# Patient Record
Sex: Female | Born: 1970 | Race: White | Hispanic: No | Marital: Single | Smoking: Never smoker
Health system: Southern US, Community
[De-identification: ages and names within clinical notes are randomized; demographics above are authoritative.]

## PROBLEM LIST (undated history)

## (undated) DIAGNOSIS — I10 Essential (primary) hypertension: Secondary | ICD-10-CM

## (undated) DIAGNOSIS — F419 Anxiety disorder, unspecified: Secondary | ICD-10-CM

## (undated) DIAGNOSIS — D649 Anemia, unspecified: Secondary | ICD-10-CM

## (undated) DIAGNOSIS — T7840XA Allergy, unspecified, initial encounter: Secondary | ICD-10-CM

## (undated) DIAGNOSIS — F32A Depression, unspecified: Secondary | ICD-10-CM

## (undated) HISTORY — DX: Anxiety disorder, unspecified: F41.9

## (undated) HISTORY — DX: Allergy, unspecified, initial encounter: T78.40XA

## (undated) HISTORY — DX: Depression, unspecified: F32.A

## (undated) HISTORY — DX: Anemia, unspecified: D64.9

---

## 2004-02-05 ENCOUNTER — Ambulatory Visit: Payer: Self-pay | Admitting: Family Medicine

## 2004-09-09 ENCOUNTER — Other Ambulatory Visit: Admission: RE | Admit: 2004-09-09 | Discharge: 2004-09-09 | Payer: Self-pay | Admitting: Obstetrics and Gynecology

## 2005-03-22 ENCOUNTER — Other Ambulatory Visit: Admission: RE | Admit: 2005-03-22 | Discharge: 2005-03-22 | Payer: Self-pay | Admitting: Obstetrics and Gynecology

## 2005-06-22 ENCOUNTER — Ambulatory Visit: Payer: Self-pay | Admitting: Family Medicine

## 2006-05-06 ENCOUNTER — Ambulatory Visit: Payer: Self-pay | Admitting: Family Medicine

## 2011-08-03 ENCOUNTER — Other Ambulatory Visit: Payer: Self-pay | Admitting: Obstetrics and Gynecology

## 2011-08-03 DIAGNOSIS — Z1231 Encounter for screening mammogram for malignant neoplasm of breast: Secondary | ICD-10-CM

## 2011-09-03 ENCOUNTER — Ambulatory Visit
Admission: RE | Admit: 2011-09-03 | Discharge: 2011-09-03 | Disposition: A | Discharge status: Home / Self Care | Source: Ambulatory Visit | Attending: Obstetrics and Gynecology | Admitting: Obstetrics and Gynecology

## 2011-09-03 DIAGNOSIS — Z1231 Encounter for screening mammogram for malignant neoplasm of breast: Secondary | ICD-10-CM

## 2011-09-06 ENCOUNTER — Other Ambulatory Visit: Payer: Self-pay | Admitting: Obstetrics and Gynecology

## 2011-09-06 DIAGNOSIS — R928 Other abnormal and inconclusive findings on diagnostic imaging of breast: Secondary | ICD-10-CM

## 2011-09-08 ENCOUNTER — Ambulatory Visit
Admission: RE | Admit: 2011-09-08 | Discharge: 2011-09-08 | Disposition: A | Discharge status: Home / Self Care | Source: Ambulatory Visit | Attending: Obstetrics and Gynecology | Admitting: Obstetrics and Gynecology

## 2011-09-08 DIAGNOSIS — R928 Other abnormal and inconclusive findings on diagnostic imaging of breast: Secondary | ICD-10-CM

## 2012-07-27 ENCOUNTER — Other Ambulatory Visit: Payer: Self-pay

## 2012-07-27 DIAGNOSIS — Z1231 Encounter for screening mammogram for malignant neoplasm of breast: Secondary | ICD-10-CM

## 2012-09-04 ENCOUNTER — Ambulatory Visit

## 2012-09-11 ENCOUNTER — Ambulatory Visit
Admission: RE | Admit: 2012-09-11 | Discharge: 2012-09-11 | Disposition: A | Discharge status: Home / Self Care | Source: Ambulatory Visit

## 2012-09-11 DIAGNOSIS — Z1231 Encounter for screening mammogram for malignant neoplasm of breast: Secondary | ICD-10-CM

## 2013-04-26 ENCOUNTER — Other Ambulatory Visit: Payer: Self-pay

## 2013-04-26 DIAGNOSIS — Z1231 Encounter for screening mammogram for malignant neoplasm of breast: Secondary | ICD-10-CM

## 2013-07-16 DIAGNOSIS — J309 Allergic rhinitis, unspecified: Secondary | ICD-10-CM | POA: Insufficient documentation

## 2013-09-12 ENCOUNTER — Ambulatory Visit
Admission: RE | Admit: 2013-09-12 | Discharge: 2013-09-12 | Disposition: A | Discharge status: Home / Self Care | Payer: BC Managed Care – PPO | Source: Ambulatory Visit

## 2013-09-12 ENCOUNTER — Encounter (INDEPENDENT_AMBULATORY_CARE_PROVIDER_SITE_OTHER): Payer: Self-pay

## 2013-09-12 DIAGNOSIS — Z1231 Encounter for screening mammogram for malignant neoplasm of breast: Secondary | ICD-10-CM

## 2016-07-06 IMAGING — MG MM SCREENING BREAST TOMO BILATERAL
9 of 12 series · 9 of 28 positions shown · non-contrast
Comparison: Previous exam(s).

CLINICAL DATA: Screening.

EXAM:
DIGITAL SCREENING BILATERAL MAMMOGRAM WITH 3D TOMO WITH CAD

[L CC]
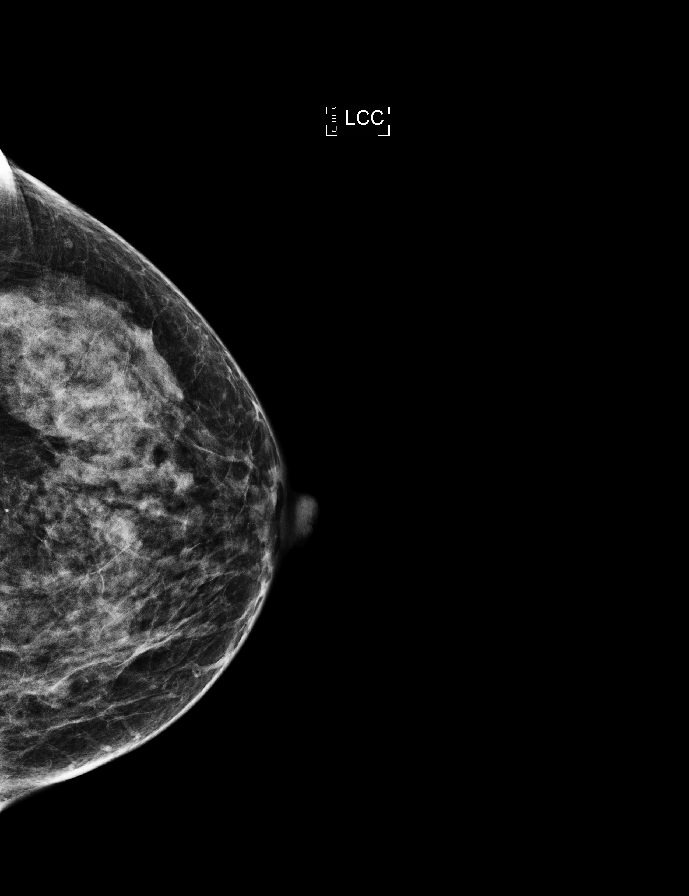

[L CC synth-2D]
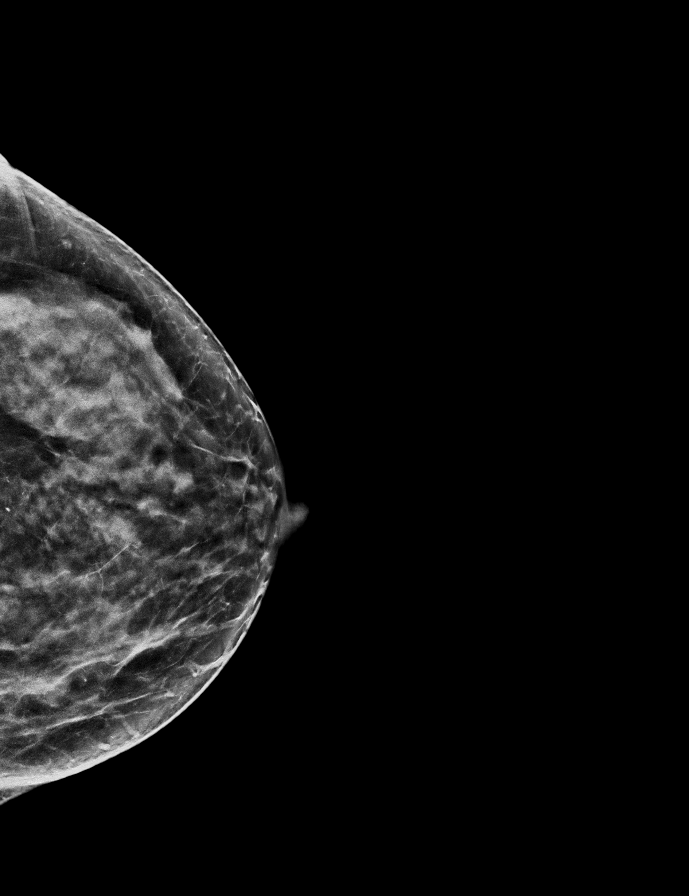

[R MLO]
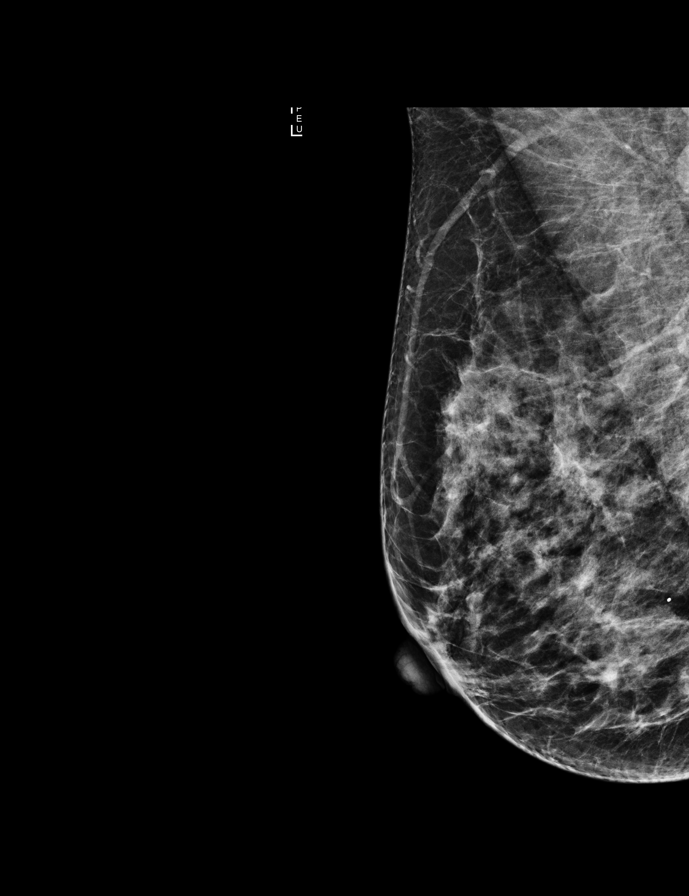

[L MLO]
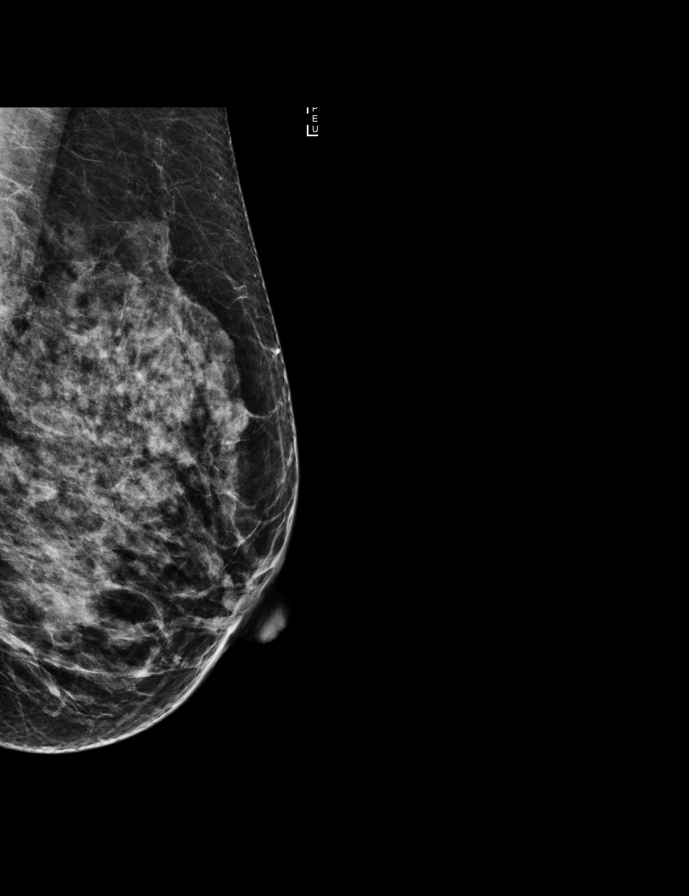

[R CC synth-2D]
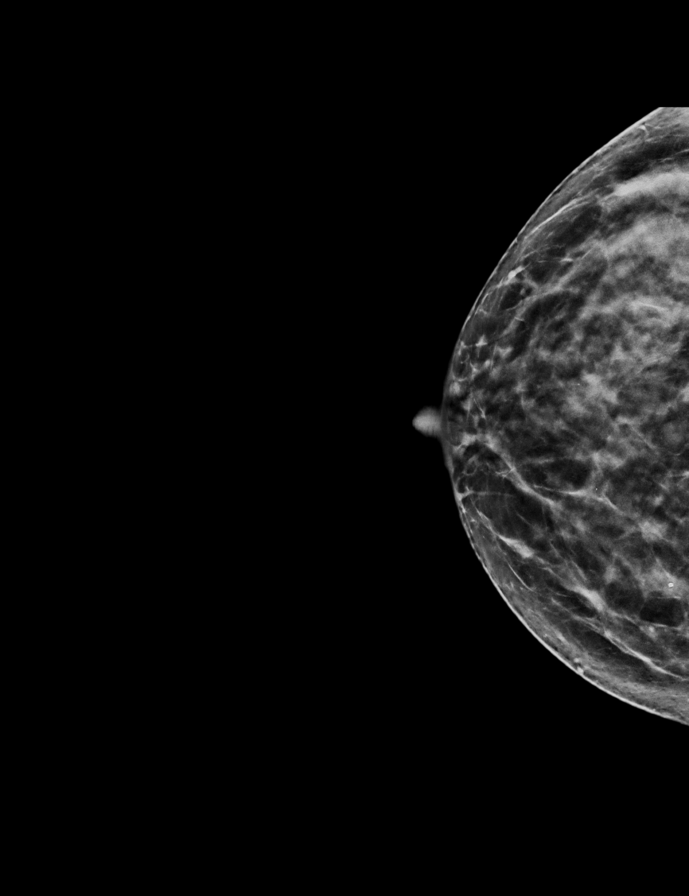

[R MLO synth-2D]
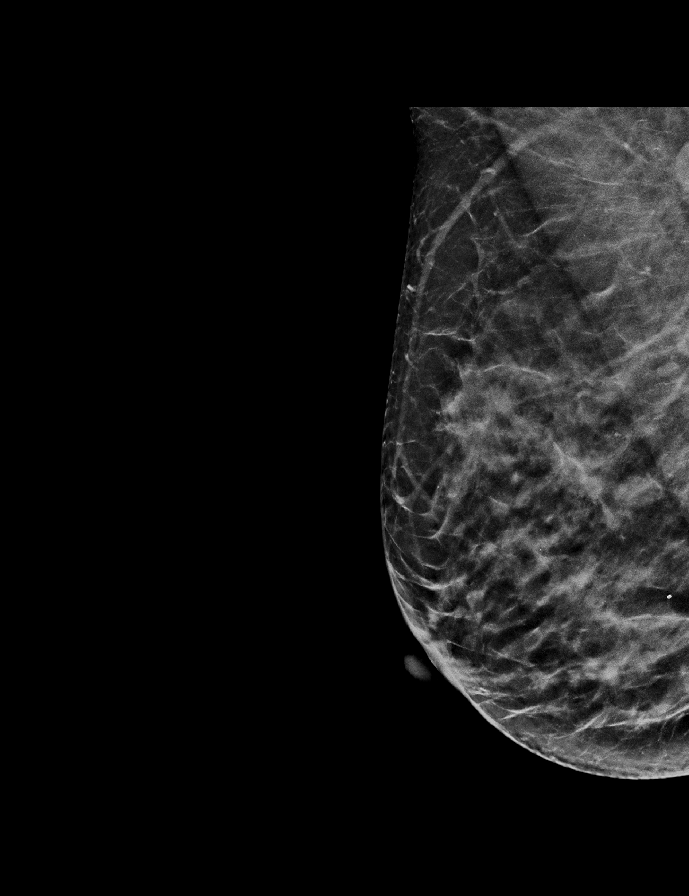

[R CC]
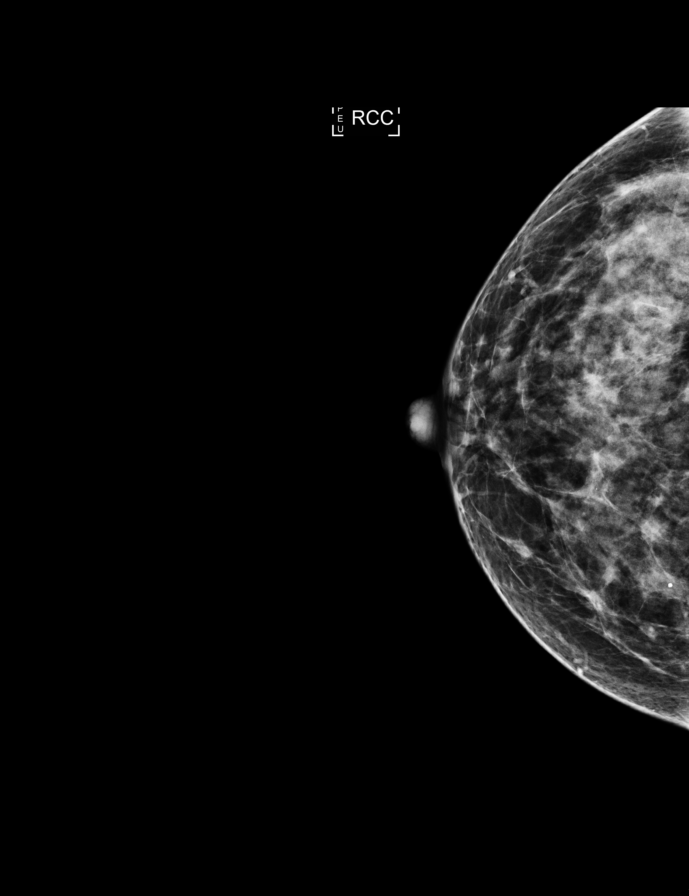

[L MLO synth-2D]
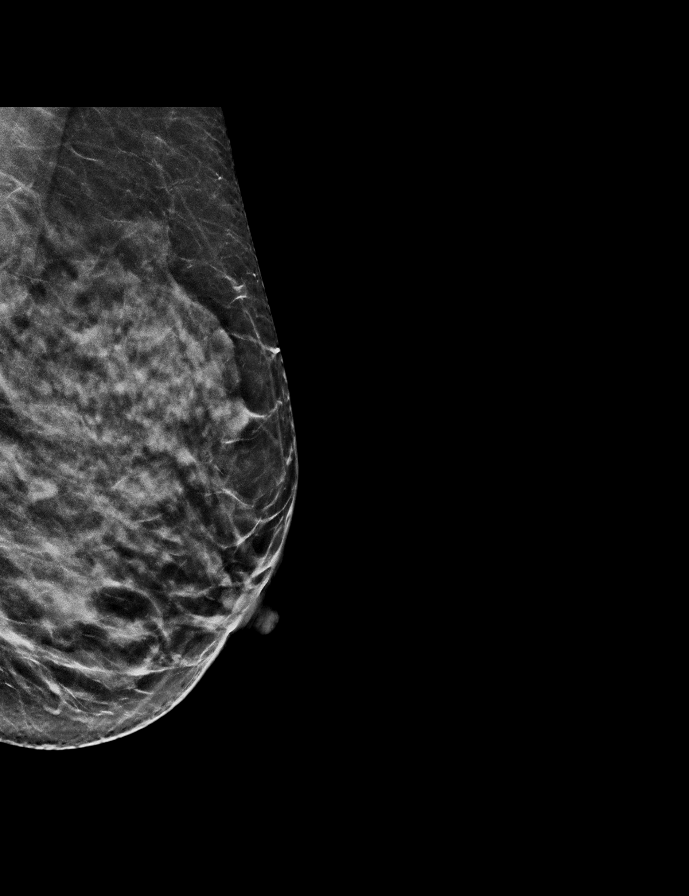

[L CC tomo · tomo slice 29/58.0]
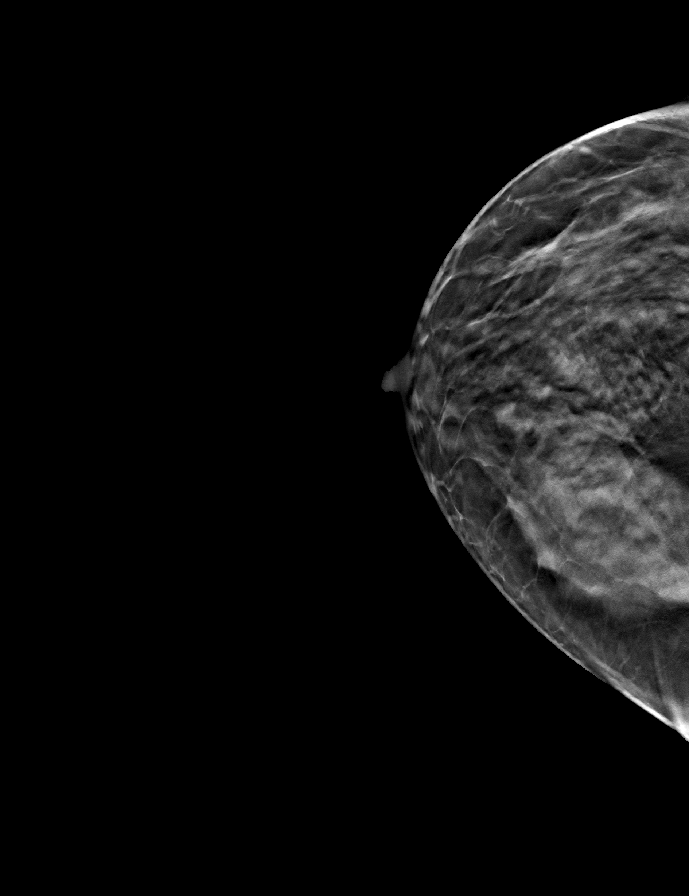

[9 of 28 positions shown; findings below may reference images not displayed]

ACR Breast Density Category c: The breast tissue is heterogeneously
dense, which may obscure small masses.
FINDINGS: There are no findings suspicious for malignancy. Images were
processed with CAD.
IMPRESSION: No mammographic evidence of malignancy. A result letter of this
screening mammogram will be mailed directly to the patient.

RECOMMENDATION:
Screening mammogram in one year. (Code:OA-G-1SS)

BI-RADS CATEGORY  1: Negative.

## 2018-02-09 DIAGNOSIS — I10 Essential (primary) hypertension: Secondary | ICD-10-CM | POA: Insufficient documentation

## 2018-02-09 DIAGNOSIS — F411 Generalized anxiety disorder: Secondary | ICD-10-CM | POA: Insufficient documentation

## 2018-11-10 DIAGNOSIS — Z0000 Encounter for general adult medical examination without abnormal findings: Secondary | ICD-10-CM | POA: Insufficient documentation

## 2018-11-10 DIAGNOSIS — H9190 Unspecified hearing loss, unspecified ear: Secondary | ICD-10-CM | POA: Insufficient documentation

## 2018-12-04 ENCOUNTER — Other Ambulatory Visit: Payer: Self-pay | Admitting: Obstetrics and Gynecology

## 2018-12-04 DIAGNOSIS — N6320 Unspecified lump in the left breast, unspecified quadrant: Secondary | ICD-10-CM

## 2018-12-11 ENCOUNTER — Other Ambulatory Visit: Payer: Self-pay

## 2018-12-11 ENCOUNTER — Ambulatory Visit
Admission: RE | Admit: 2018-12-11 | Discharge: 2018-12-11 | Disposition: A | Discharge status: Home / Self Care | Source: Ambulatory Visit | Attending: Obstetrics and Gynecology | Admitting: Obstetrics and Gynecology

## 2018-12-11 DIAGNOSIS — N6320 Unspecified lump in the left breast, unspecified quadrant: Secondary | ICD-10-CM

## 2019-07-16 DIAGNOSIS — H6993 Unspecified Eustachian tube disorder, bilateral: Secondary | ICD-10-CM | POA: Insufficient documentation

## 2019-07-16 DIAGNOSIS — J343 Hypertrophy of nasal turbinates: Secondary | ICD-10-CM | POA: Insufficient documentation

## 2019-07-16 DIAGNOSIS — J342 Deviated nasal septum: Secondary | ICD-10-CM | POA: Insufficient documentation

## 2020-03-11 LAB — COLOGUARD: COLOGUARD: NEGATIVE

## 2020-12-24 ENCOUNTER — Ambulatory Visit: Admission: EM | Admit: 2020-12-24 | Discharge: 2020-12-24 | Disposition: A | Discharge status: Home / Self Care

## 2020-12-24 ENCOUNTER — Other Ambulatory Visit: Payer: Self-pay

## 2020-12-24 ENCOUNTER — Ambulatory Visit

## 2020-12-24 DIAGNOSIS — H6983 Other specified disorders of Eustachian tube, bilateral: Secondary | ICD-10-CM

## 2020-12-24 DIAGNOSIS — J3089 Other allergic rhinitis: Secondary | ICD-10-CM

## 2020-12-24 HISTORY — DX: Essential (primary) hypertension: I10

## 2020-12-24 MED ORDER — PREDNISONE 10 MG PO TABS: 30.0000 mg | ORAL_TABLET | Freq: Every day | ORAL | 0 refills | Status: DC

## 2020-12-24 MED ORDER — PSEUDOEPHEDRINE HCL 30 MG PO TABS: 30.0000 mg | ORAL_TABLET | Freq: Three times a day (TID) | ORAL | 0 refills | Status: DC | PRN

## 2020-12-24 MED ORDER — MONTELUKAST SODIUM 10 MG PO TABS: 10.0000 mg | ORAL_TABLET | Freq: Every day | ORAL | 0 refills | Status: AC

## 2020-12-24 NOTE — ED Provider Notes (Signed)
[facility]   MRN: [id number] DOB: 1970/05/25  Subjective:   Catherine Higgins is a 50 y.o. female presenting for 2 day history of bilateral ear fullness, worse over the right side.  Has very transient occasional sharp pains but no fever, drainage, tinnitus.  Has a history of persistent allergic rhinitis, deviated septum.  Currently she uses Atrovent nasal spray, just switch back to Allegra.  Has been advised to have a procedure to correct her deviated septum but has not gone through with this.  About 6 months ago, she had a similar episode and was advised to have a prednisone course for eustachian tube dysfunction.  She reports that she did very well with this.  No current facility-administered medications for this encounter.  Current Outpatient Medications:    buPROPion (WELLBUTRIN XL) 300 MG 24 hr tablet, Take by mouth., Disp: , Rfl:    LORazepam (ATIVAN) 0.5 MG tablet, lorazepam 0.5 mg tablet  TAKE 1 TABLET BY MOUTH EVERY DAY AS NEEDED FOR ANXIETY, Disp: , Rfl:    acyclovir (ZOVIRAX) 400 MG tablet, Take 400 mg by mouth 2 (two) times daily., Disp: , Rfl:    fexofenadine (ALLEGRA) 180 MG tablet, Take by mouth., Disp: , Rfl:    hydrochlorothiazide (HYDRODIURIL) 25 MG tablet, Take 25 mg by mouth daily., Disp: , Rfl:    ipratropium (ATROVENT) 0.03 % nasal spray, SMARTSIG:1 Spray(s) Both Nares Twice Daily PRN, Disp: , Rfl:    LEVONEST 50-30/75-40/ 125-30 MCG TABS, Take 1 tablet by mouth daily., Disp: , Rfl:    Allergies  Allergen Reactions   Amoxicillin Itching and Hives   Clarithromycin Itching and Hives   Codeine Itching and Hives   Sulfamethoxazole-Trimethoprim Itching and Hives    Past Medical History:  Diagnosis Date   Hypertension      History reviewed. No pertinent surgical history.  Family History  Adopted: Yes    Social History   Tobacco Use   Smoking status: Never   Smokeless tobacco: Never    ROS   Objective:   Vitals: BP (!) 145/89 (BP  Location: Right Arm)   Pulse 89   Temp 99 F (37.2 C) (Oral)   Resp 18   SpO2 98%   Physical Exam Constitutional:      General: She is not in acute distress.    Appearance: Normal appearance. She is well-developed. She is not ill-appearing, toxic-appearing or diaphoretic.  HENT:     Head: Normocephalic and atraumatic.     Right Ear: Tympanic membrane, ear canal and external ear normal. There is no impacted cerumen.     Left Ear: Tympanic membrane, ear canal and external ear normal. There is no impacted cerumen.     Nose: Nose normal.     Mouth/Throat:     Mouth: Mucous membranes are moist.     Pharynx: Oropharynx is clear.  Eyes:     General: No scleral icterus.       Right eye: No discharge.        Left eye: No discharge.     Extraocular Movements: Extraocular movements intact.     Conjunctiva/sclera: Conjunctivae normal.     Pupils: Pupils are equal, round, and reactive to light.  Cardiovascular:     Rate and Rhythm: Normal rate.  Pulmonary:     Effort: Pulmonary effort is normal.  Skin:    General: Skin is warm and dry.  Neurological:     General: No focal deficit present.     Mental  Status: She is alert and oriented to person, place, and time.  Psychiatric:        Mood and Affect: Mood normal.        Behavior: Behavior normal.        Thought Content: Thought content normal.        Judgment: Judgment normal.    Assessment and Plan :   PDMP not reviewed this encounter.  1. Eustachian tube dysfunction, bilateral   2. Allergic rhinitis due to other allergic trigger, unspecified seasonality    Unremarkable ENT exam.  Will use conservative management for what I suspect is eustachian tube dysfunction.  Recommended starting Flonase, Allegra, Singulair.  As she did well with the prednisone course before, offered her a repeat, recommended that she use Sudafed thereafter as needed.  Discussed that this is safe to use at a lower dose with her high blood pressure.  Follow-up  with PCP for recheck on the blood pressure.  Counseled patient on potential for adverse effects with medications prescribed/recommended today, ER and return-to-clinic precautions discussed, patient verbalized understanding.    Jaynee Eagles, PA-C 12/24/20 1349

## 2020-12-24 NOTE — ED Triage Notes (Signed)
Two day h/o right ear clogged sensation. Notes decreased hearing on that ear. Has tried peroxide and cerumen removal without a decrease in sxs. No left ear sxs.

## 2020-12-24 NOTE — Discharge Instructions (Addendum)
For your allergies, take Flonase, Allegra and Singulair daily. Use Sudafed at 14m 2-3 times daily keeping a close eye on the effects it has on your blood pressure. For your ETD, we'll use a short prednisone course.

## 2021-10-04 IMAGING — MG MM DIGITAL DIAGNOSTIC UNILAT*L* W/ TOMO W/ CAD
4 series · 4 of 12 positions shown · non-contrast
Comparison: Previous exam(s).

CLINICAL DATA: Possible left breast mass seen on most recent
screening mammography.

EXAM:
DIGITAL DIAGNOSTIC LEFT MAMMOGRAM WITH CAD AND TOMO
ULTRASOUND LEFT BREAST

[L MLO synth-2D]
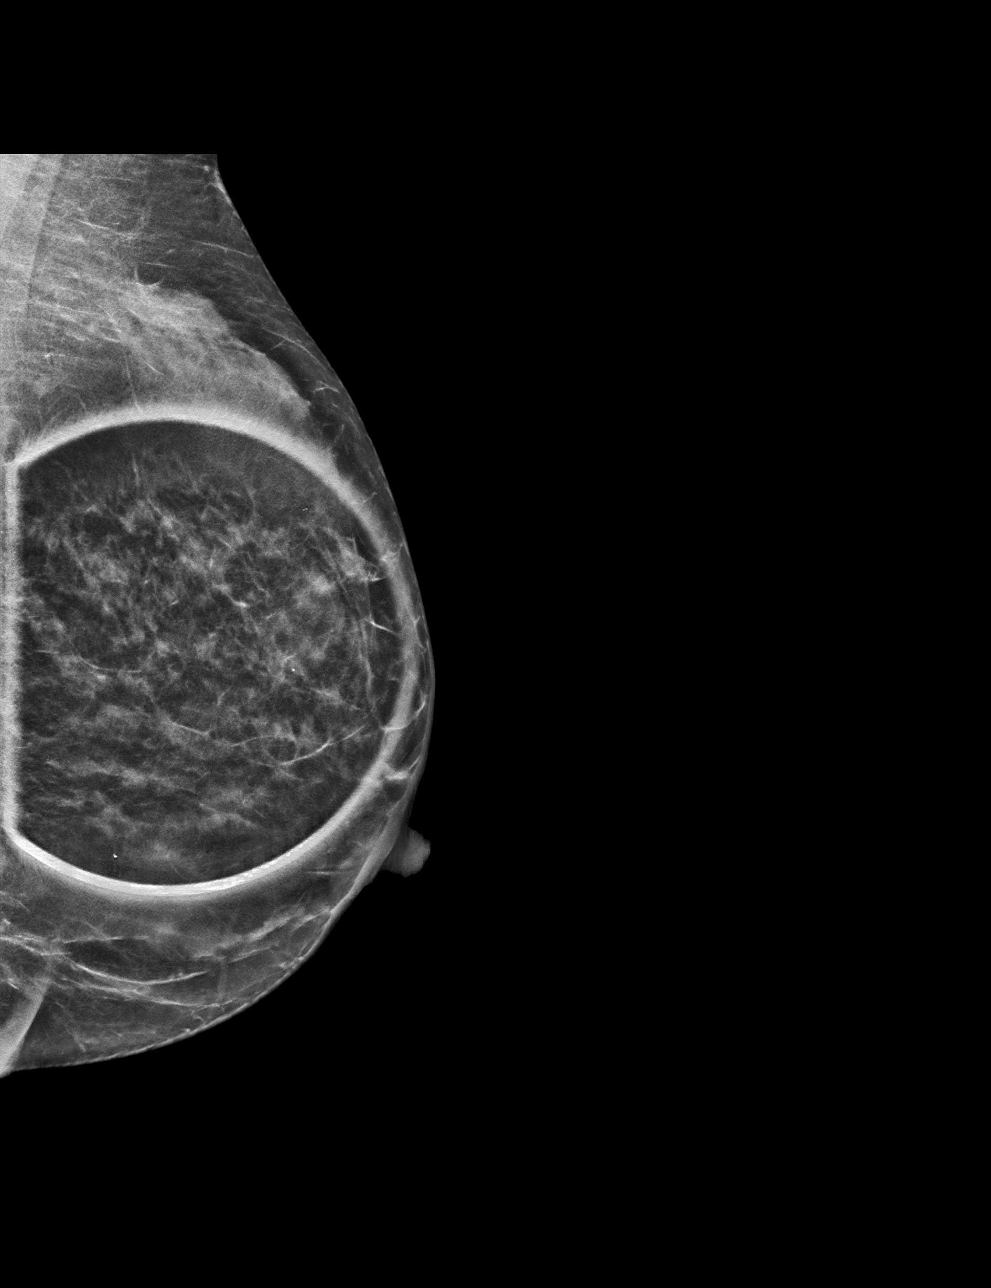

[L CC synth-2D]
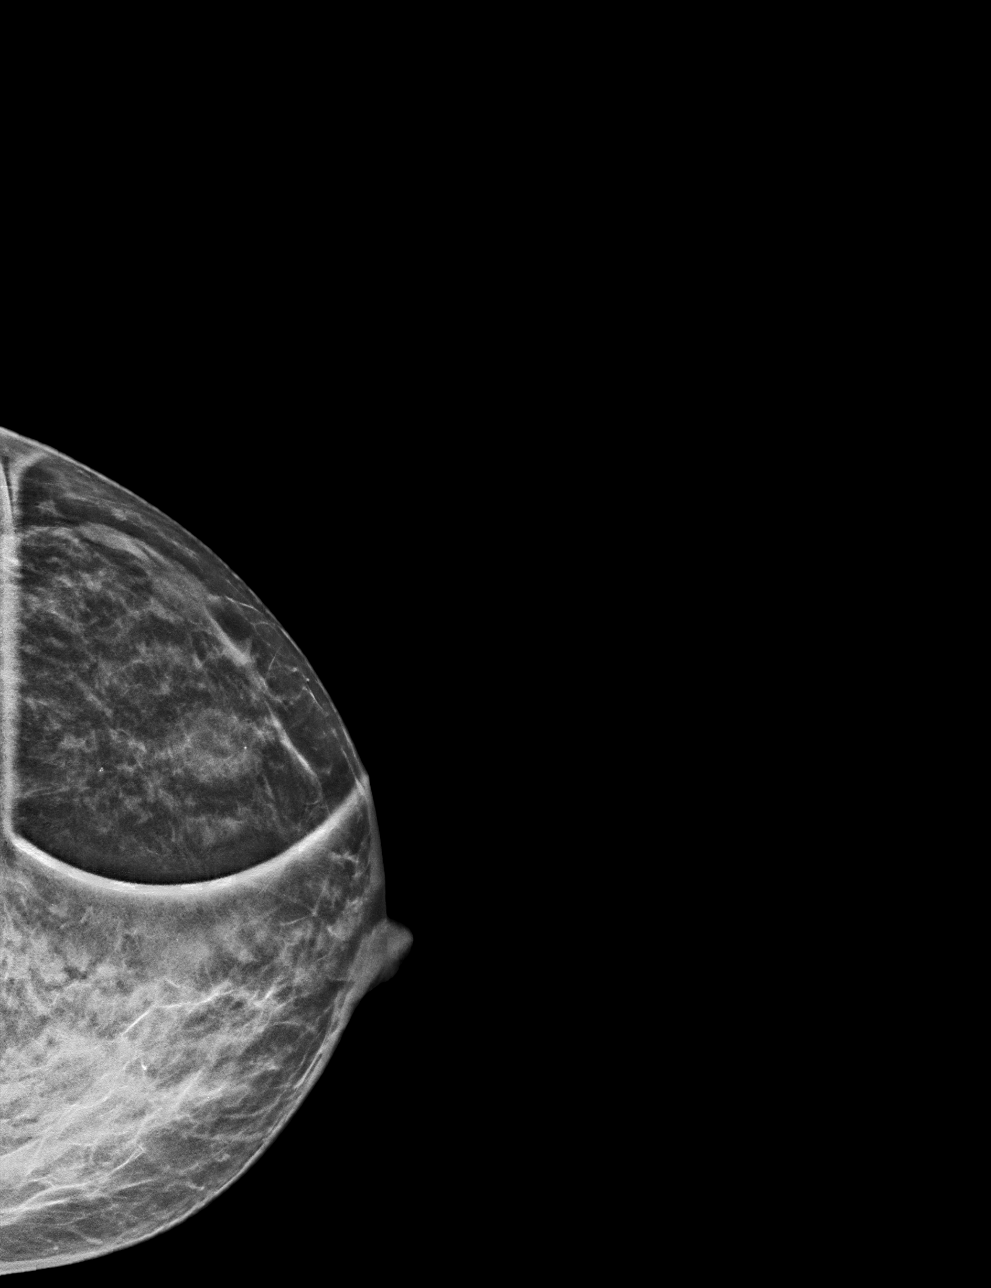

[L CC tomo · tomo slice 21/40.0]
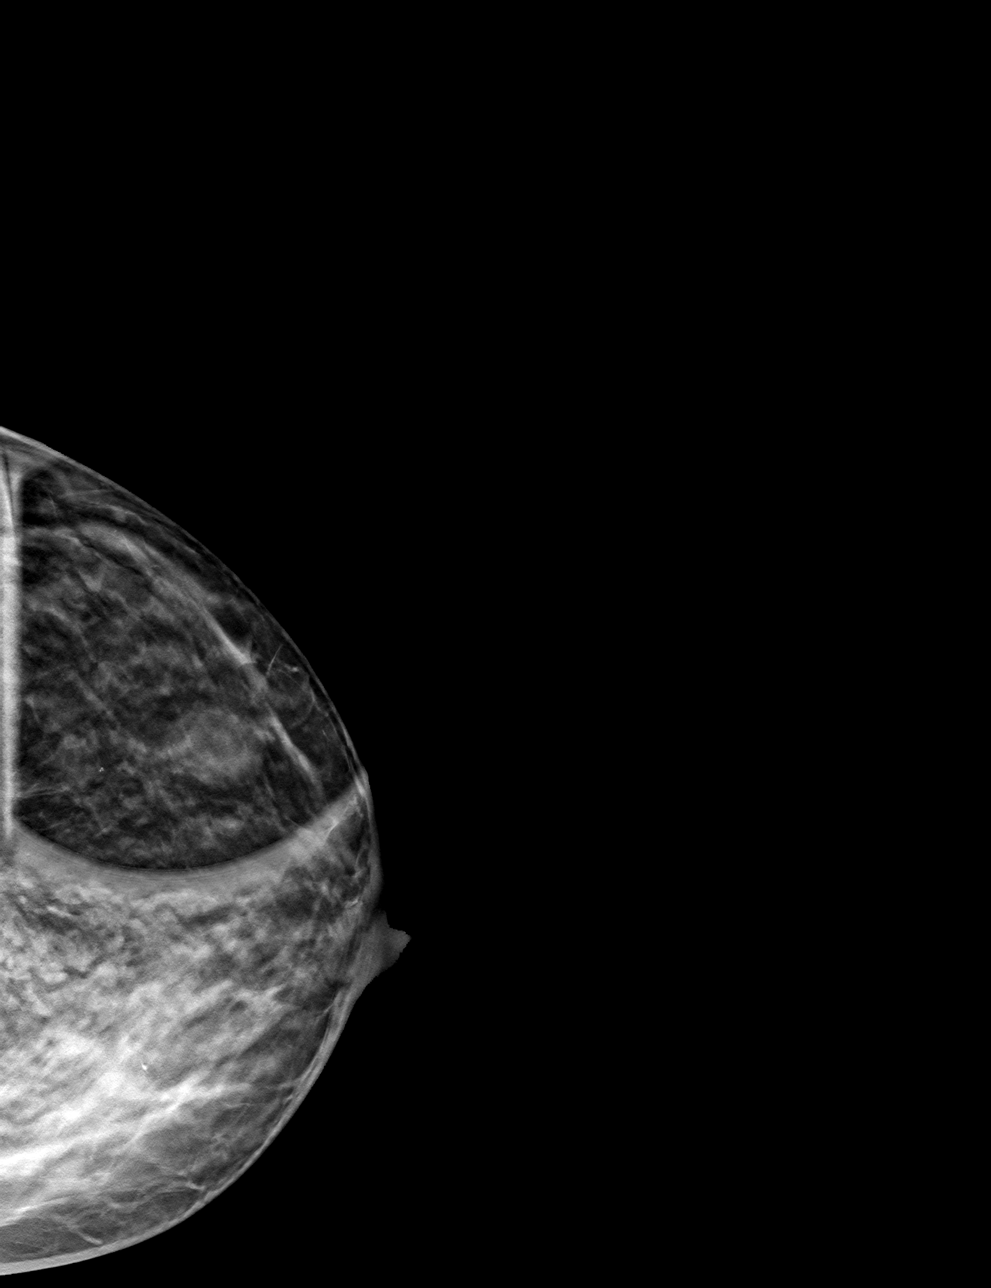

[L MLO tomo · tomo slice 23/45.0]
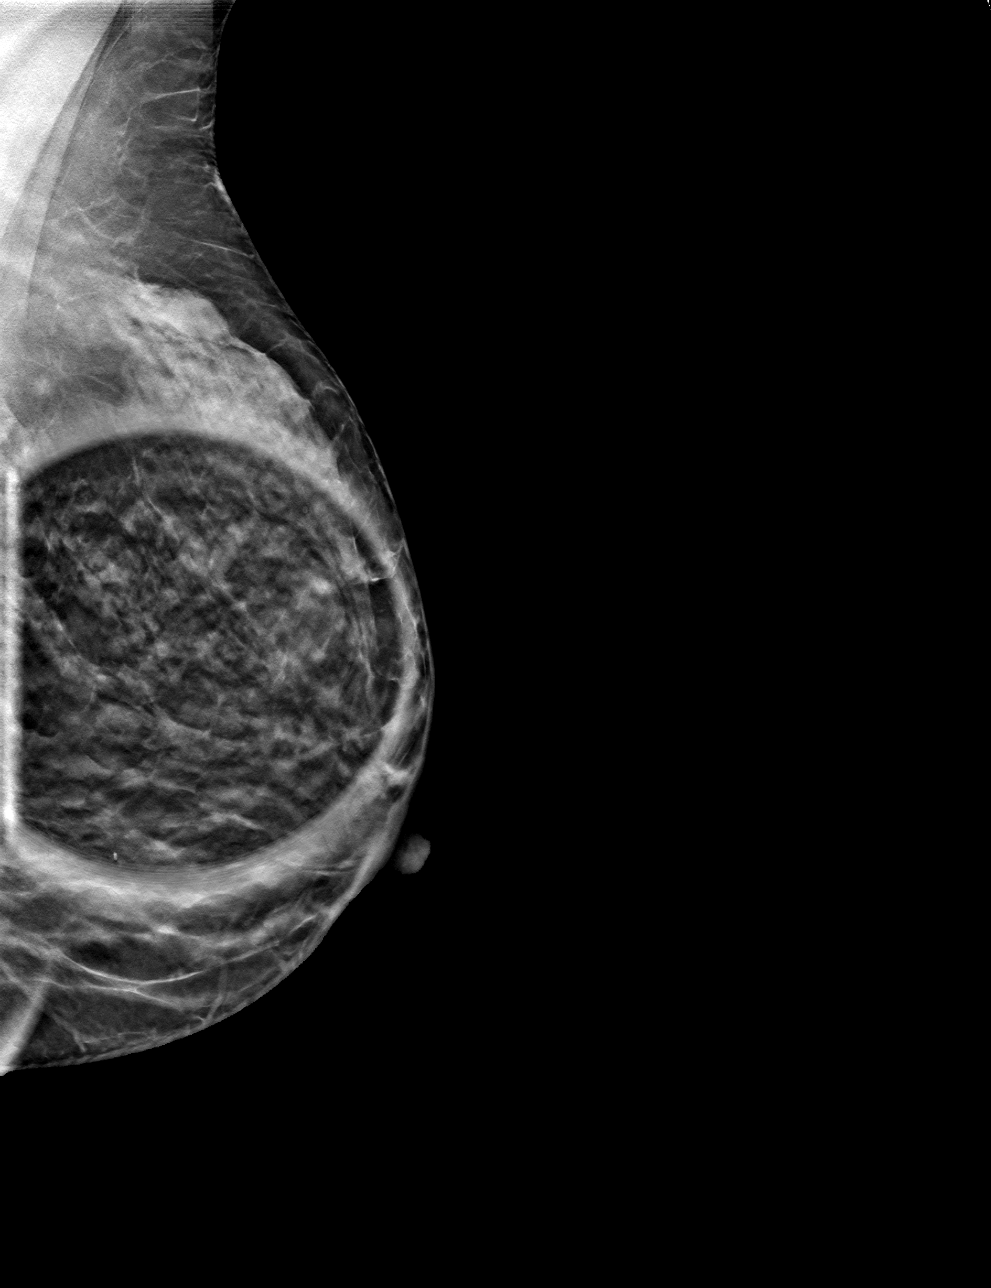

[4 of 12 positions shown; findings below may reference images not displayed]

ACR Breast Density Category c: The breast tissue is heterogeneously
dense, which may obscure small masses.
FINDINGS: Additional mammographic views of the left breast demonstrate
persistent circumscribed isodense to breast parenchyma mass in the
left breast slightly upper outer quadrant, middle depth, measuring
1.7 cm mammographically. Single peripheral calcification is noted
associated with the wall of this mass.

Mammographic images were processed with CAD.

Targeted ultrasound is performed, showing left breast 2:30 o'clock 2
cm from the nipple benign-appearing cyst measuring 1.5 by 1.6 by
cm. This finding corresponds to the mammographically seen mass.
IMPRESSION: Left breast upper outer quadrant benign-appearing cyst.

No mammographic evidence of left breast malignancy.

RECOMMENDATION:
Screening mammogram in one year.(Code:QD-[id number])

I have discussed the findings and recommendations with the patient.
If applicable, a reminder letter will be sent to the patient
regarding the next appointment.

BI-RADS CATEGORY  2: Benign.

## 2022-07-23 ENCOUNTER — Encounter: Payer: Self-pay | Admitting: Internal Medicine

## 2022-08-06 DIAGNOSIS — F32A Depression, unspecified: Secondary | ICD-10-CM | POA: Insufficient documentation

## 2022-08-06 DIAGNOSIS — R87619 Unspecified abnormal cytological findings in specimens from cervix uteri: Secondary | ICD-10-CM | POA: Insufficient documentation

## 2022-08-06 DIAGNOSIS — R319 Hematuria, unspecified: Secondary | ICD-10-CM | POA: Insufficient documentation

## 2022-08-06 DIAGNOSIS — I10 Essential (primary) hypertension: Secondary | ICD-10-CM | POA: Insufficient documentation

## 2022-08-06 DIAGNOSIS — L720 Epidermal cyst: Secondary | ICD-10-CM | POA: Insufficient documentation

## 2022-08-12 ENCOUNTER — Ambulatory Visit (AMBULATORY_SURGERY_CENTER)

## 2022-08-12 VITALS — Ht 62.0 in | Wt 130.0 lb

## 2022-08-12 DIAGNOSIS — Z1211 Encounter for screening for malignant neoplasm of colon: Secondary | ICD-10-CM

## 2022-08-12 NOTE — Progress Notes (Signed)
No egg or soy allergy known to patient  No issues known to pt with past sedation with any surgeries or procedures Patient denies ever being told they had issues or difficulty with intubation  No FH of Malignant Hyperthermia Pt is not on diet pills Pt is not on  home 02  Pt is not on blood thinners  Pt denies issues with constipation  No A fib or A flutter Have any cardiac testing pending--no Pt instructed to use [email or web address] or GoodRx for a price reduction on prep   

## 2022-08-26 ENCOUNTER — Encounter: Payer: Self-pay | Admitting: Certified Registered Nurse Anesthetist

## 2022-09-01 ENCOUNTER — Ambulatory Visit (AMBULATORY_SURGERY_CENTER): Admitting: Internal Medicine

## 2022-09-01 ENCOUNTER — Encounter: Payer: Self-pay | Admitting: Internal Medicine

## 2022-09-01 VITALS — BP 137/83 | HR 76 | Temp 98.9°F | Resp 12 | Ht 62.0 in | Wt 130.0 lb

## 2022-09-01 DIAGNOSIS — Z1211 Encounter for screening for malignant neoplasm of colon: Secondary | ICD-10-CM | POA: Diagnosis not present

## 2022-09-01 MED ORDER — SODIUM CHLORIDE 0.9 % IV SOLN: 500.0000 mL | Freq: Once | INTRAVENOUS | Status: DC

## 2022-09-01 NOTE — Op Note (Signed)
[facility] Patient Name: Catherine Higgins Procedure Date: 09/01/2022 8:30 AM MRN: [id number] Endoscopist: Iva Boop , MD, [id number] Age: 52 Referring MD:  Date of Birth: 19-Sep-1970 Gender: Female Account #: [id number] Procedure:                Colonoscopy Indications:              Screening for colorectal malignant neoplasm, This                            is the patient's first colonoscopy Medicines:                Monitored Anesthesia Care Procedure:                Pre-Anesthesia Assessment:                           - Prior to the procedure, a History and Physical                            was performed, and patient medications and                            allergies were reviewed. The patient's tolerance of                            previous anesthesia was also reviewed. The risks                            and benefits of the procedure and the sedation                            options and risks were discussed with the patient.                            All questions were answered, and informed consent                            was obtained. Prior Anticoagulants: The patient has                            taken no anticoagulant or antiplatelet agents. ASA                            Grade Assessment: II - A patient with mild systemic                            disease. After reviewing the risks and benefits,                            the patient was deemed in satisfactory condition to                            undergo the procedure.  After obtaining informed consent, the colonoscope                            was passed under direct vision. Throughout the                            procedure, the patient's blood pressure, pulse, and                            oxygen saturations were monitored continuously. The                            Olympus PCF-H190DL [id number]) Colonoscope was                            introduced through the anus  and advanced to the the                            cecum, identified by appendiceal orifice and                            ileocecal valve. The colonoscopy was performed                            without difficulty. The patient tolerated the                            procedure well. The quality of the bowel                            preparation was excellent. The ileocecal valve,                            appendiceal orifice, and rectum were photographed.                            The bowel preparation used was Miralax via split                            dose instruction. Scope In: 8:51:27 AM Scope Out: 9:03:37 AM Scope Withdrawal Time: 0 hours 8 minutes 25 seconds  Total Procedure Duration: 0 hours 12 minutes 10 seconds  Findings:                 The perianal and digital rectal examinations were                            normal.                           The entire examined colon appeared normal on direct                            and retroflexion views. Complications:            No immediate complications. Estimated Blood  Loss:     Estimated blood loss: none. Impression:               - The entire examined colon is normal on direct and                            retroflexion views.                           - No specimens collected. Recommendation:           - Patient has a contact number available for                            emergencies. The signs and symptoms of potential                            delayed complications were discussed with the                            patient. Return to normal activities tomorrow.                            Written discharge instructions were provided to the                            patient.                           - Resume previous diet.                           - Continue present medications.                           - Repeat colonoscopy in 10 years for screening                            purposes. Iva Boop,  MD 09/01/2022 9:10:30 AM This report has been signed electronically.

## 2022-09-01 NOTE — Patient Instructions (Addendum)
Your colonoscopy was normal. No polyps or cancer were seen.  Next routine colonoscopy or other screening test in 10 years - 2034.  I appreciate the opportunity to care for you. Iva Boop, MD, FACG  YOU HAD AN ENDOSCOPIC PROCEDURE TODAY AT THE [facility]:   Refer to the procedure report that was given to you for any specific questions about what was found during the examination.  If the procedure report does not answer your questions, please call your gastroenterologist to clarify.  If you requested that your care partner not be given the details of your procedure findings, then the procedure report has been included in a sealed envelope for you to review at your convenience later.  YOU SHOULD EXPECT: Some feelings of bloating in the abdomen. Passage of more gas than usual.  Walking can help get rid of the air that was put into your GI tract during the procedure and reduce the bloating. If you had a lower endoscopy (such as a colonoscopy or flexible sigmoidoscopy) you may notice spotting of blood in your stool or on the toilet paper. If you underwent a bowel prep for your procedure, you may not have a normal bowel movement for a few days.  Please Note:  You might notice some irritation and congestion in your nose or some drainage.  This is from the oxygen used during your procedure.  There is no need for concern and it should clear up in a day or so.  SYMPTOMS TO REPORT IMMEDIATELY:  Following lower endoscopy (colonoscopy or flexible sigmoidoscopy):  Excessive amounts of blood in the stool  Significant tenderness or worsening of abdominal pains  Swelling of the abdomen that is new, acute  Fever of 100F or higher  For urgent or emergent issues, a gastroenterologist can be reached at any hour by calling [phone]. Do not use MyChart messaging for urgent concerns.    DIET:  We do recommend a small meal at first, but then you may proceed to your regular diet.  Drink  plenty of fluids but you should avoid alcoholic beverages for 24 hours.  ACTIVITY:  You should plan to take it easy for the rest of today and you should NOT DRIVE or use heavy machinery until tomorrow (because of the sedation medicines used during the test).    FOLLOW UP: Our staff will call the number listed on your records the next business day following your procedure.  We will call around 7:15- 8:00 am to check on you and address any questions or concerns that you may have regarding the information given to you following your procedure. If we do not reach you, we will leave a message.     If any biopsies were taken you will be contacted by phone or by letter within the next 1-3 weeks.  Please call us at [phone] if you have not heard about the biopsies in 3 weeks.    SIGNATURES/CONFIDENTIALITY: You and/or your care partner have signed paperwork which will be entered into your electronic medical record.  These signatures attest to the fact that that the information above on your After Visit Summary has been reviewed and is understood.  Full responsibility of the confidentiality of this discharge information lies with you and/or your care-partner.

## 2022-09-01 NOTE — Progress Notes (Signed)
[facility] History and Physical   Primary Care Physician:  [facility]   Reason for Procedure:   CRCA screening  Plan:    colonoscopy     HPI: Catherine Higgins is a 52 y.o. female here for screening exam   Past Medical History:  Diagnosis Date   Allergy    Anemia    Anxiety    Depression    Hypertension     History reviewed. No pertinent surgical history.  Prior to Admission medications   Medication Sig Start Date End Date Taking? Authorizing Provider  ascorbic acid (QC VITAMIN C WITH ROSE HIPS) 500 MG tablet Take 500 mg by mouth daily.   Yes [provider]  Bacillus Coagulans-Inulin (PROBIOTIC) 1-250 BILLION-MG CAPS Take by mouth.   Yes [provider]  buPROPion (WELLBUTRIN XL) 300 MG 24 hr tablet Take by mouth. 07/01/20  Yes [provider]  Cholecalciferol (VITAMIN D3) 25 MCG (1000 UT) CAPS Take by mouth.   Yes [provider]  cyanocobalamin (VITAMIN B12) 1000 MCG tablet Take 1,000 mcg by mouth daily.   Yes [provider]  fexofenadine (ALLEGRA) 180 MG tablet Take by mouth.   Yes [provider]  hydrochlorothiazide (HYDRODIURIL) 25 MG tablet Take 25 mg by mouth daily. 11/19/20  Yes [provider]  ipratropium (ATROVENT) 0.03 % nasal spray SMARTSIG:1 Spray(s) Both Nares Twice Daily PRN 10/23/20  Yes [provider]  LEVONEST 50-30/75-40/ 125-30 MCG TABS Take 1 tablet by mouth daily. 11/19/20  Yes [provider]  magnesium (MAGTAB) 84 MG ( ) TBCR SR tablet Take 84 mg by mouth.   Yes [provider]  montelukast (SINGULAIR) 10 MG tablet Take 1 tablet (10 mg total) by mouth at bedtime. 12/24/20  Yes Wallis Bamberg, PA-C  Multiple Vitamin (MULTIVITAMIN ADULT PO) Take by mouth.   Yes [provider]  Zinc 50 MG TABS Take by mouth.   Yes [provider]    Current Outpatient Medications  Medication Sig Dispense Refill    ascorbic acid (QC VITAMIN C WITH ROSE HIPS) 500 MG tablet Take 500 mg by mouth daily.     Bacillus Coagulans-Inulin (PROBIOTIC) 1-250 BILLION-MG CAPS Take by mouth.     buPROPion (WELLBUTRIN XL) 300 MG 24 hr tablet Take by mouth.     Cholecalciferol (VITAMIN D3) 25 MCG (1000 UT) CAPS Take by mouth.     cyanocobalamin (VITAMIN B12) 1000 MCG tablet Take 1,000 mcg by mouth daily.     fexofenadine (ALLEGRA) 180 MG tablet Take by mouth.     hydrochlorothiazide (HYDRODIURIL) 25 MG tablet Take 25 mg by mouth daily.     ipratropium (ATROVENT) 0.03 % nasal spray SMARTSIG:1 Spray(s) Both Nares Twice Daily PRN     LEVONEST 50-30/75-40/ 125-30 MCG TABS Take 1 tablet by mouth daily.     magnesium (MAGTAB) 84 MG ( ) TBCR SR tablet Take 84 mg by mouth.     montelukast (SINGULAIR) 10 MG tablet Take 1 tablet (10 mg total) by mouth at bedtime. 90 tablet 0   Multiple Vitamin (MULTIVITAMIN ADULT PO) Take by mouth.     Zinc 50 MG TABS Take by mouth.     Current Facility-Administered Medications  Medication Dose Route Frequency Provider Last Rate Last Admin   0.9 %  sodium chloride infusion  500 mL Intravenous Once Iva Boop, MD        Allergies as of 09/01/2022 - Review Complete 09/01/2022  Allergen Reaction  Noted   Amoxicillin Itching and Hives 07/16/2013   Clarithromycin Itching and Hives 07/16/2013   Codeine Itching and Hives 07/16/2013   Sulfamethoxazole-trimethoprim Itching and Hives 07/16/2013    Family History  Adopted: Yes  Problem Relation Age of Onset   Colon polyps Neg Hx    Esophageal cancer Neg Hx    Rectal cancer Neg Hx    Stomach cancer Neg Hx     Social History   Socioeconomic History   Marital status: Single    Spouse name: Not on file   Number of children: Not on file   Years of education: Not on file   Highest education level: Not on file  Occupational History   Not on file  Tobacco Use   Smoking status: Never   Smokeless tobacco: Never  Substance and Sexual  Activity   Alcohol use: Never   Drug use: Never   Sexual activity: Not on file  Other Topics Concern   Not on file  Social History Narrative   Not on file   Social Determinants of Health   Financial Resource Strain: Not on file  Food Insecurity: Not on file  Transportation Needs: Not on file  Physical Activity: Not on file  Stress: Not on file  Social Connections: Not on file  Intimate Partner Violence: Not on file    Review of Systems:  All other review of systems negative except as mentioned in the HPI.  Physical Exam: Vital signs BP (!) 159/86   Pulse 78   Temp 98.9 F (37.2 C) (Temporal)   Resp 15   Ht 5\' 2"  (1.575 m)   Wt 130 lb (59 kg)   LMP 08/03/2022   SpO2 100%   BMI 23.78 kg/m   General:   Alert,  Well-developed, well-nourished, pleasant and cooperative in NAD Lungs:  Clear throughout to auscultation.   Heart:  Regular rate and rhythm; no murmurs, clicks, rubs,  or gallops. Abdomen:  Soft, nontender and nondistended. Normal bowel sounds.   Neuro/Psych:  Alert and cooperative. Normal mood and affect. A and O x 3   @Allison [employer]els  Sena Slate, MD, [facility] [phone] (pager) 09/01/2022 8:42 AM@

## 2022-09-01 NOTE — Progress Notes (Signed)
Report given to PACU, vss 

## 2022-09-01 NOTE — Progress Notes (Signed)
Vitals-DT  Pt's states no medical or surgical changes since previsit or office visit.  

## 2022-09-01 NOTE — Progress Notes (Signed)
0855 Patient experiencing nausea and vomiting.  MD updated and Zofran 4 mg IV given, vss  

## 2022-09-02 ENCOUNTER — Telehealth: Payer: Self-pay | Admitting: *Deleted

## 2022-09-02 NOTE — Telephone Encounter (Signed)
  Follow up Call-     09/01/2022    7:51 AM 09/01/2022    7:47 AM  Call back number  Post procedure Call Back phone  # [phone]   Permission to leave phone message  Yes     Patient questions:  Do you have a fever, pain , or abdominal swelling? No. Pain Score  0 *  Have you tolerated food without any problems? Yes.    Have you been able to return to your normal activities? Yes.    Do you have any questions about your discharge instructions: Diet   No. Medications  No. Follow up visit  No.  Do you have questions or concerns about your Care? No.  Actions: * If pain score is 4 or above: No action needed, pain <4.

## 2024-03-21 ENCOUNTER — Other Ambulatory Visit (HOSPITAL_COMMUNITY): Payer: Self-pay | Admitting: Obstetrics and Gynecology

## 2024-03-21 DIAGNOSIS — M79604 Pain in right leg: Secondary | ICD-10-CM

## 2024-03-23 ENCOUNTER — Ambulatory Visit (HOSPITAL_COMMUNITY)
Admission: RE | Admit: 2024-03-23 | Discharge: 2024-03-23 | Disposition: A | Discharge status: Home / Self Care | Source: Ambulatory Visit | Attending: Surgery | Admitting: Surgery

## 2024-03-23 DIAGNOSIS — M79605 Pain in left leg: Secondary | ICD-10-CM | POA: Diagnosis present

## 2024-03-23 DIAGNOSIS — M79604 Pain in right leg: Secondary | ICD-10-CM | POA: Insufficient documentation
